# Patient Record
Sex: Female | Born: 1960 | Race: White | Hispanic: No | Marital: Married
Health system: Southern US, Community
[De-identification: ages and names within clinical notes are randomized; demographics above are authoritative.]

---

## 1997-09-01 ENCOUNTER — Ambulatory Visit (HOSPITAL_COMMUNITY): Admission: RE | Admit: 1997-09-01 | Discharge: 1997-09-01 | Payer: Self-pay | Admitting: Obstetrics and Gynecology

## 1998-06-19 ENCOUNTER — Other Ambulatory Visit: Admission: RE | Admit: 1998-06-19 | Discharge: 1998-06-19 | Payer: Self-pay | Admitting: Obstetrics and Gynecology

## 1999-05-17 ENCOUNTER — Ambulatory Visit (HOSPITAL_COMMUNITY): Admission: RE | Admit: 1999-05-17 | Discharge: 1999-05-17 | Payer: Self-pay | Admitting: *Deleted

## 1999-05-17 ENCOUNTER — Encounter: Payer: Self-pay | Admitting: Obstetrics and Gynecology

## 1999-07-09 ENCOUNTER — Other Ambulatory Visit: Admission: RE | Admit: 1999-07-09 | Discharge: 1999-07-09 | Payer: Self-pay | Admitting: Obstetrics and Gynecology

## 2000-12-20 ENCOUNTER — Other Ambulatory Visit: Admission: RE | Admit: 2000-12-20 | Discharge: 2000-12-20 | Payer: Self-pay | Admitting: Obstetrics and Gynecology

## 2001-12-21 ENCOUNTER — Other Ambulatory Visit: Admission: RE | Admit: 2001-12-21 | Discharge: 2001-12-21 | Payer: Self-pay | Admitting: Obstetrics and Gynecology

## 2003-01-29 ENCOUNTER — Other Ambulatory Visit: Admission: RE | Admit: 2003-01-29 | Discharge: 2003-01-29 | Payer: Self-pay | Admitting: Obstetrics and Gynecology

## 2004-03-31 ENCOUNTER — Other Ambulatory Visit: Admission: RE | Admit: 2004-03-31 | Discharge: 2004-03-31 | Payer: Self-pay | Admitting: Obstetrics and Gynecology

## 2005-06-27 ENCOUNTER — Other Ambulatory Visit: Admission: RE | Admit: 2005-06-27 | Discharge: 2005-06-27 | Payer: Self-pay | Admitting: Obstetrics and Gynecology

## 2012-06-13 ENCOUNTER — Other Ambulatory Visit: Payer: Self-pay | Admitting: Obstetrics and Gynecology

## 2012-06-13 DIAGNOSIS — R928 Other abnormal and inconclusive findings on diagnostic imaging of breast: Secondary | ICD-10-CM

## 2012-06-20 ENCOUNTER — Ambulatory Visit
Admission: RE | Admit: 2012-06-20 | Discharge: 2012-06-20 | Disposition: A | Discharge status: Home / Self Care | Payer: BC Managed Care – PPO | Source: Ambulatory Visit | Attending: Obstetrics and Gynecology | Admitting: Obstetrics and Gynecology

## 2012-06-20 DIAGNOSIS — R928 Other abnormal and inconclusive findings on diagnostic imaging of breast: Secondary | ICD-10-CM

## 2014-11-06 ENCOUNTER — Other Ambulatory Visit: Payer: Self-pay | Admitting: Obstetrics and Gynecology

## 2014-11-06 DIAGNOSIS — R928 Other abnormal and inconclusive findings on diagnostic imaging of breast: Secondary | ICD-10-CM

## 2014-11-12 ENCOUNTER — Ambulatory Visit
Admission: RE | Admit: 2014-11-12 | Discharge: 2014-11-12 | Disposition: A | Discharge status: Home / Self Care | Payer: BC Managed Care – PPO | Source: Ambulatory Visit | Attending: Obstetrics and Gynecology | Admitting: Obstetrics and Gynecology

## 2014-11-12 DIAGNOSIS — R928 Other abnormal and inconclusive findings on diagnostic imaging of breast: Secondary | ICD-10-CM

## 2018-10-09 ENCOUNTER — Other Ambulatory Visit: Payer: Self-pay | Admitting: Family Medicine

## 2018-10-09 DIAGNOSIS — H5713 Ocular pain, bilateral: Secondary | ICD-10-CM

## 2018-10-16 ENCOUNTER — Ambulatory Visit
Admission: RE | Admit: 2018-10-16 | Discharge: 2018-10-16 | Disposition: A | Discharge status: Home / Self Care | Payer: BC Managed Care – PPO | Source: Ambulatory Visit | Attending: Family Medicine | Admitting: Family Medicine

## 2018-10-16 DIAGNOSIS — H5713 Ocular pain, bilateral: Secondary | ICD-10-CM

## 2021-05-07 ENCOUNTER — Other Ambulatory Visit: Payer: Self-pay | Admitting: Family Medicine

## 2021-05-07 DIAGNOSIS — Z780 Asymptomatic menopausal state: Secondary | ICD-10-CM

## 2021-05-07 DIAGNOSIS — Z1231 Encounter for screening mammogram for malignant neoplasm of breast: Secondary | ICD-10-CM

## 2021-06-29 ENCOUNTER — Ambulatory Visit
Admission: RE | Admit: 2021-06-29 | Discharge: 2021-06-29 | Disposition: A | Discharge status: Home / Self Care | Payer: BC Managed Care – PPO | Source: Ambulatory Visit | Attending: Family Medicine | Admitting: Family Medicine

## 2021-06-29 DIAGNOSIS — Z1231 Encounter for screening mammogram for malignant neoplasm of breast: Secondary | ICD-10-CM

## 2021-06-30 ENCOUNTER — Other Ambulatory Visit: Payer: Self-pay | Admitting: Family Medicine

## 2021-06-30 DIAGNOSIS — R928 Other abnormal and inconclusive findings on diagnostic imaging of breast: Secondary | ICD-10-CM

## 2021-07-15 ENCOUNTER — Other Ambulatory Visit: Payer: Self-pay | Admitting: Family Medicine

## 2021-07-15 ENCOUNTER — Ambulatory Visit
Admission: RE | Admit: 2021-07-15 | Discharge: 2021-07-15 | Disposition: A | Discharge status: Home / Self Care | Payer: BC Managed Care – PPO | Source: Ambulatory Visit | Attending: Family Medicine | Admitting: Family Medicine

## 2021-07-15 DIAGNOSIS — R928 Other abnormal and inconclusive findings on diagnostic imaging of breast: Secondary | ICD-10-CM

## 2021-12-01 ENCOUNTER — Ambulatory Visit: Payer: 59 | Attending: Family Medicine

## 2021-12-01 DIAGNOSIS — M6281 Muscle weakness (generalized): Secondary | ICD-10-CM | POA: Insufficient documentation

## 2021-12-01 DIAGNOSIS — R279 Unspecified lack of coordination: Secondary | ICD-10-CM | POA: Diagnosis not present

## 2021-12-01 DIAGNOSIS — N8110 Cystocele, unspecified: Secondary | ICD-10-CM | POA: Diagnosis present

## 2021-12-01 NOTE — Therapy (Addendum)
?OUTPATIENT PHYSICAL THERAPY FEMALE PELVIC EVALUATION ? ? ?Patient Name: Donna Decker ?MRN: [id number] ?DOB:[birth year], 61 y.o., female ?Today's Date: 12/06/2021 ? ? ? ? ?History reviewed. No pertinent past medical history. ?History reviewed. No pertinent surgical history. ?There are no problems to display for this patient. ? ? ?PCP: Sigmund Hazel, MD ? ?REFERRING PROVIDER: Gweneth Dimitri, MD ? ?REFERRING DIAG: N81.10 (ICD-10-CM) - Cystocele, unspecified ? ?THERAPY DIAG:  ?Muscle weakness (generalized) - Plan: PT plan of care cert/re-cert ? ?Unspecified lack of coordination - Plan: PT plan of care cert/re-cert ? ?ONSET DATE: 09/01/21 ? ?SUBJECTIVE:                                                                                                                                                                                          ? ?SUBJECTIVE STATEMENT: ?Pt states that she is very healthy and strong and she was recently laying patio in the back yard. She was laying 35lb blocks and starting to feel heaviness in vaginal canal. She went to the bathroom and noticed that something was starting to bulge out of the vagina. She states that she does kegels frequently.  ?Fluid intake: Yes: at least 64oz a day, cup of coffee and cup of decaf tea   ? ?Patient confirms identification and approves PT to assess pelvic floor and treatment Yes ? ? ?PAIN:  ?Are you having pain? No ?NPRS scale: 0/10 ? ? ?PRECAUTIONS: None ? ?WEIGHT BEARING RESTRICTIONS No ? ?FALLS:  ?Has patient fallen in last 6 months? No ? ?LIVING ENVIRONMENT: ?Lives with: lives with their family ?Lives in: House/apartment ? ?OCCUPATION: retired ? ?PLOF: Independent ? ?PATIENT GOALS decrease sensation of vagina pressure ? ?PERTINENT HISTORY:  ?1 complicated vaginal delivery ?Sexual abuse: No ? ?BOWEL MOVEMENT ?Pain with bowel movement: No ?Type of bowel movement:Frequency 1x/day and Strain No ?Fully empty rectum: No ?Leakage: No ?Pads: No ?Fiber supplement:  No ? ?URINATION ?Pain with urination: No ?Fully empty bladder: Yes: but she is more aware of rushing ?Stream:  WNL ?Urgency: No ?Frequency: every 2-3 hours, getting up at night more - 1x/night ?Leakage: Lifting ?Pads: No ? ?INTERCOURSE ?Pain with intercourse:  NOne ?Ability to have vaginal penetration:  Yes: - ?Climax: yes ? ? ?PREGNANCY ?Vaginal deliveries 1 (2 adopted children) ?Tearing Yes: large episiotomy, long labor, coccyx fracture ?C-section deliveries 0 ?Currently pregnant No ? ?PROLAPSE ?Cystocele patient does not typically have sensation of lower abdominal pressure, but after days of lots of heavy lifting, she will; she denies any back pain ? ? ? ?OBJECTIVE:  ? ?COGNITION: ? Overall cognitive status: Within functional limits for tasks assessed   ?  ?  SENSATION: ? Light touch: Appears intact ? Proprioception: Appears intact ? ? ? ?PELVIC MMT: ?Pelvic floor strength 2/5 ?Good coordination of contraction with 6 repetitions - some difficulty with full relaxation ? Pelvic floor endurance 4 seconds ? ? ?      PALPATION: ?  General  Diastasis 2 inch at and above umbilicus without distortion ? ?              External Perineal Exam palor/dryness noted ?              ?              Internal Pelvic Floor no tenderness ? ?TONE: ?WNL ? ?PROLAPSE: ?Grade 1 anterior vaginal wall laxity in supine - pt deferred standing assessment ? ?TODAY'S TREATMENT 12/01/21 ?EVAL  ?Manual: ?Soft tissue mobilization: ?Scar tissue mobilization: ?Myofascial release: ?Spinal mobilization: ?Internal pelvic floor techniques: ?Dry needling: ?Neuromuscular re-education: ?Core retraining:  ?Core facilitation: ?Form correction: ?Pelvic floor contraction training: ?Quick flicks 10x with breath coordination ?Long holds 5 x 10 seconds ?Down training: ?Exercises: ?Stretches/mobility: ?Strengthening: ?Therapeutic activities: ?Functional strengthening activities: ?Self-care: ?Vulvovaginal massage ?Inverted lying ? ?  ? ? ? ?PATIENT EDUCATION:  ?Education  details: see above self-care ?Person educated: Patient ?Education method: Explanation, Demonstration, Tactile cues, Verbal cues, and Handouts ?Education comprehension: verbalized understanding ? ? ?HOME EXERCISE PROGRAM: ?[id number] ? ?ASSESSMENT: ? ?CLINICAL IMPRESSION: ?Patient is a 61 y.o. female who was seen today for physical therapy evaluation and treatment for sensation of vaginal bulging. Exam findings notable for vulvar dryness/pallor, diastasis recti 2 finger width separation at and above umbilicus without distortion, pelvic floor weakness 2/5, decreased pelvic floor endurance 4 seconds, mild difficulty with full relaxation with repeat contractions, and grade 1 anterior vaginal wall laxity only assessed in supine per patient request. Initial treatment consisted on inverted lying for when patient is feeling increased symptoms,vulvar moisturizing, and initial pelvic floor strengthening program. She will benefit from skilled PT intervention in order to strengthen pelvic floor, teach techniques to help reduce amount of vaginal pressure she feels during strenuous activities, and begin functional strengthening program for core/pelvic floor.  ? ? ?OBJECTIVE IMPAIRMENTS decreased activity tolerance, decreased coordination, decreased endurance, and decreased strength.  ? ?ACTIVITY LIMITATIONS  physical activity .  ? ?PERSONAL FACTORS 1 comorbidity: complicated vaginal delivery  are also affecting patient's functional outcome.  ? ? ?REHAB POTENTIAL: Good ? ?CLINICAL DECISION MAKING: Stable/uncomplicated ? ?EVALUATION COMPLEXITY: Low ? ? ?GOALS: ?Goals reviewed with patient? Yes ? ?SHORT TERM GOALS: Target date: 01/03/2022 ? ?Pt will be independent with HEP.  ? ?Baseline: ?Goal status: INITIAL ? ?2.  Pt will be able to correctly perform diaphragmatic breathing and appropriate pressure management in order to prevent worsening vaginal wall laxity and improve pelvic floor A/ROM.  ? ?Baseline:  ?Goal status:  INITIAL ? ? ?LONG TERM GOALS: Target date: 02/23/22 ? ?Pt will be independent with advanced HEP.  ? ?Baseline:  ?Goal status: INITIAL ? ?2.  Pt will demonstrate normal pelvic floor muscle tone and A/ROM, able to achieve 4/5 strength with contractions and 10 sec endurance, in order to provide appropriate lumbopelvic support in functional activities.  ? ?Baseline:  ?Goal status: INITIAL ? ?3.  Pt will demonstrate appropriate abdominal pressure management, breath coordination, and pelvic floor/core contraction with functional movements and lifting activities.  ?Baseline:  ?Goal status: INITIAL ? ? ? ?PLAN: ?PT FREQUENCY: 1x/week ? ?PT DURATION: 12 weeks ? ?PLANNED INTERVENTIONS: Therapeutic exercises, Therapeutic activity, Neuromuscular re-education, Balance training, Gait training, Patient/Family  education, Joint mobilization, Dry Needling, Biofeedback, and Manual therapy ? ?PLAN FOR NEXT SESSION: Begin core training; start progressing core and pelvic floor into functional strengthening.  ? ?Julio AlmKristen Aubreana Cornacchia, PT, DPT05/08/238:51 AM ? ? ?

## 2021-12-01 NOTE — Patient Instructions (Signed)
Relieving pelvic pressure: ?Lying in an inverted position can help relieve the ?sensation of pelvic pressure ?Your legs can be up the wall, on a chair, or over the couch; the important ?thing is to make sure you have hips are elevated ? ?? Perform this position: ?When you feel pressure throughout the day ?At the end of the day ?While in this position, perform deep, diaphragmatic breathing to help all of ?your muscles relax lying ? ?

## 2021-12-16 ENCOUNTER — Ambulatory Visit: Payer: 59

## 2021-12-16 DIAGNOSIS — N8110 Cystocele, unspecified: Secondary | ICD-10-CM | POA: Diagnosis not present

## 2021-12-16 DIAGNOSIS — R279 Unspecified lack of coordination: Secondary | ICD-10-CM

## 2021-12-16 DIAGNOSIS — M6281 Muscle weakness (generalized): Secondary | ICD-10-CM

## 2021-12-16 NOTE — Therapy (Signed)
OUTPATIENT PHYSICAL THERAPY TREATMENT NOTE   Patient Name: Donna Decker MRN: [id number] DOB:[birth year], 61 y.o., female Today's Date: 12/16/2021  PCP: Kathyrn Lass, MD REFERRING PROVIDER: Cari Caraway, MD  END OF SESSION:   PT End of Session - 12/16/21 1146     Visit Number 2    Date for PT Re-Evaluation 02/23/22    Authorization Type Friday    PT Start Time 1145    PT Stop Time 1223    PT Time Calculation (min) 38 min    Activity Tolerance Patient tolerated treatment well    Behavior During Therapy [facility] for tasks assessed/performed             History reviewed. No pertinent past medical history. History reviewed. No pertinent surgical history. There are no problems to display for this patient.   REFERRING DIAG: N81.10 (ICD-10-CM) - Cystocele, unspecified  THERAPY DIAG:  Muscle weakness (generalized)  Unspecified lack of coordination  PERTINENT HISTORY: 1 complicated vaginal delivery  PRECAUTIONS: NA  SUBJECTIVE: Pt states that she has been working on exercises, but is ready to move onto something more challenging.   PAIN:  Are you having pain? No   SUBJECTIVE STATEMENT: Pt states that she is very healthy and strong and she was recently laying patio in the back yard. She was laying 35lb blocks and starting to feel heaviness in vaginal canal. She went to the bathroom and noticed that something was starting to bulge out of the vagina. She states that she does kegels frequently.  Fluid intake: Yes: at least 64oz a day, cup of coffee and cup of decaf tea     Patient confirms identification and approves PT to assess pelvic floor and treatment Yes     PAIN:  Are you having pain? No NPRS scale: 0/10     PRECAUTIONS: None   WEIGHT BEARING RESTRICTIONS No   FALLS:  Has patient fallen in last 6 months? No   LIVING ENVIRONMENT: Lives with: lives with their family Lives in: House/apartment   OCCUPATION: retired   PLOF: Independent   PATIENT GOALS  decrease sensation of vagina pressure   PERTINENT HISTORY:  1 complicated vaginal delivery Sexual abuse: No   BOWEL MOVEMENT Pain with bowel movement: No Type of bowel movement:Frequency 1x/day and Strain No Fully empty rectum: No Leakage: No Pads: No Fiber supplement: No   URINATION Pain with urination: No Fully empty bladder: Yes: but she is more aware of rushing Stream:  WNL Urgency: No Frequency: every 2-3 hours, getting up at night more - 1x/night Leakage: Lifting Pads: No   INTERCOURSE Pain with intercourse:  NOne Ability to have vaginal penetration:  Yes: - Climax: yes     PREGNANCY Vaginal deliveries 1 (2 adopted children) Tearing Yes: large episiotomy, long labor, coccyx fracture C-section deliveries 0 Currently pregnant No   PROLAPSE Cystocele patient does not typically have sensation of lower abdominal pressure, but after days of lots of heavy lifting, she will; she denies any back pain       OBJECTIVE:    COGNITION:            Overall cognitive status: Within functional limits for tasks assessed                          SENSATION:            Light touch: Appears intact            Proprioception: Appears intact  PELVIC MMT: Pelvic floor strength 2/5 Good coordination of contraction with 6 repetitions - some difficulty with full relaxation  Pelvic floor endurance 4 seconds           PALPATION:   General  Diastasis 2 inch at and above umbilicus without distortion                 External Perineal Exam palor/dryness noted                             Internal Pelvic Floor no tenderness   TONE: WNL   PROLAPSE: Grade 1 anterior vaginal wall laxity in supine - pt deferred standing assessment   TODAY'S TREATMENT 12/16/21: Manual: Soft tissue mobilization: Scar tissue mobilization: Myofascial release: Spinal mobilization: Internal pelvic floor techniques: Dry needling: Neuromuscular re-education: Core retraining:  Transversus  abdominus training with multimodal cues and incorporation of pelvic floor and breath coordination Core facilitation: Supine march 2 x 10 Leg extensions 10x bil Cat/cow 10x Bird dog 2 x 10 Bear crawl holds 5 x 10 seconds Form correction: Pelvic floor contraction training: Down training: Exercises: Stretches/mobility: Strengthening: Bil UE extensions 2 x 10 green band  Pallof press 10x bil green band Kneeling chop 5 lbs 10x bil Therapeutic activities: Functional strengthening activities: Self-care: Poise impressa - sizing kit given Pressure management Reviewed inverted lying and best position for actual reduction of prolapse if she is feeling   TREATMENT 12/01/21 EVAL  Manual: Soft tissue mobilization: Scar tissue mobilization: Myofascial release: Spinal mobilization: Internal pelvic floor techniques: Dry needling: Neuromuscular re-education: Core retraining:  Core facilitation: Form correction: Pelvic floor contraction training: Quick flicks 66M with breath coordination Long holds 5 x 10 seconds Down training: Exercises: Stretches/mobility: Strengthening: Therapeutic activities: Functional strengthening activities: Self-care: Vulvovaginal massage Inverted lying                         PATIENT EDUCATION:  Education details: Exercise progressions Person educated: Patient Education method: Consulting civil engineer, Demonstration, Corporate treasurer cues, Verbal cues, and Handouts Education comprehension: verbalized understanding     HOME EXERCISE PROGRAM: [id number]   ASSESSMENT:   CLINICAL IMPRESSION: Pt encouraged to perform inverted lying more regularly when she feels vaginal pressure to help reduce symptoms. We discussed poise impressa as great temporary option if she is feeling more symptoms or knows that she will be doing a lot of heavy lifting; sizing kit given and she was instructed to start with size 2. She did very well with core training and incorporation of pelvic floor  with breath. She was able to incorporate this training into more functional activities and given updated HEP. She will benefit from skilled PT intervention in order to strengthen pelvic floor, teach techniques to help reduce amount of vaginal pressure she feels during strenuous activities, and begin functional strengthening program for core/pelvic floor.      OBJECTIVE IMPAIRMENTS decreased activity tolerance, decreased coordination, decreased endurance, and decreased strength.    ACTIVITY LIMITATIONS  physical activity .    PERSONAL FACTORS 1 comorbidity: complicated vaginal delivery  are also affecting patient's functional outcome.      REHAB POTENTIAL: Good   CLINICAL DECISION MAKING: Stable/uncomplicated   EVALUATION COMPLEXITY: Low     GOALS: Goals reviewed with patient? Yes   SHORT TERM GOALS: Target date: 01/03/2022   Pt will be independent with HEP.    Baseline: Goal status: INITIAL   2.  Pt will be able  to correctly perform diaphragmatic breathing and appropriate pressure management in order to prevent worsening vaginal wall laxity and improve pelvic floor A/ROM.    Baseline:  Goal status: INITIAL     LONG TERM GOALS: Target date: 02/23/22   Pt will be independent with advanced HEP.    Baseline:  Goal status: INITIAL   2.  Pt will demonstrate normal pelvic floor muscle tone and A/ROM, able to achieve 4/5 strength with contractions and 10 sec endurance, in order to provide appropriate lumbopelvic support in functional activities.    Baseline:  Goal status: INITIAL   3.  Pt will demonstrate appropriate abdominal pressure management, breath coordination, and pelvic floor/core contraction with functional movements and lifting activities.  Baseline:  Goal status: INITIAL       PLAN: PT FREQUENCY: 1x/week   PT DURATION: 12 weeks   PLANNED INTERVENTIONS: Therapeutic exercises, Therapeutic activity, Neuromuscular re-education, Balance training, Gait training,  Patient/Family education, Joint mobilization, Dry Needling, Biofeedback, and Manual therapy   PLAN FOR NEXT SESSION: Progress core/pelvic floor strengthening with appropriate pressure management; big functional movements with more hip strengthening incorporated.    Heather Roberts, PT, DPT05/18/2312:25 PM

## 2022-01-04 ENCOUNTER — Ambulatory Visit: Payer: 59 | Attending: Family Medicine

## 2022-01-04 DIAGNOSIS — M6281 Muscle weakness (generalized): Secondary | ICD-10-CM | POA: Diagnosis present

## 2022-01-04 DIAGNOSIS — R279 Unspecified lack of coordination: Secondary | ICD-10-CM | POA: Diagnosis present

## 2022-01-04 NOTE — Therapy (Signed)
OUTPATIENT PHYSICAL THERAPY TREATMENT NOTE   Patient Name: Donna Decker MRN: [id number] DOB:1960/12/21, 61 y.o., female Today's Date: 01/04/2022  PCP: Kathyrn Lass, MD REFERRING PROVIDER: Cari Caraway, MD  END OF SESSION:   PT End of Session - 01/04/22 1017     Visit Number 3    Date for PT Re-Evaluation 02/23/22    Authorization Type Friday    PT Start Time 1015    PT Stop Time 1055    PT Time Calculation (min) 40 min    Activity Tolerance Patient tolerated treatment well    Behavior During Therapy [facility] for tasks assessed/performed              History reviewed. No pertinent past medical history. History reviewed. No pertinent surgical history. There are no problems to display for this patient.   REFERRING DIAG: N81.10 (ICD-10-CM) - Cystocele, unspecified  THERAPY DIAG:  Muscle weakness (generalized)  Unspecified lack of coordination  PERTINENT HISTORY: 1 complicated vaginal delivery  PRECAUTIONS: NA  SUBJECTIVE: Pt states that she is feeling good. She is doing all exercises once a day. She reports feeling much stronger in her core. She does still feel bulge when she is going to the bathroom and wiping. She denies any pain. She feels like exercises are also helping her low back pain. She has not used poise impressa.   PAIN:  Are you having pain? No   SUBJECTIVE STATEMENT: Pt states that she is very healthy and strong and she was recently laying patio in the back yard. She was laying 35lb blocks and starting to feel heaviness in vaginal canal. She went to the bathroom and noticed that something was starting to bulge out of the vagina. She states that she does kegels frequently.  Fluid intake: Yes: at least 64oz a day, cup of coffee and cup of decaf tea     Patient confirms identification and approves PT to assess pelvic floor and treatment Yes     PAIN:  Are you having pain? No NPRS scale: 0/10     PRECAUTIONS: None   WEIGHT BEARING RESTRICTIONS  No   FALLS:  Has patient fallen in last 6 months? No   LIVING ENVIRONMENT: Lives with: lives with their family Lives in: House/apartment   OCCUPATION: retired   PLOF: Independent   PATIENT GOALS decrease sensation of vagina pressure   PERTINENT HISTORY:  1 complicated vaginal delivery Sexual abuse: No   BOWEL MOVEMENT Pain with bowel movement: No Type of bowel movement:Frequency 1x/day and Strain No Fully empty rectum: No Leakage: No Pads: No Fiber supplement: No   URINATION Pain with urination: No Fully empty bladder: Yes: but she is more aware of rushing Stream:  WNL Urgency: No Frequency: every 2-3 hours, getting up at night more - 1x/night Leakage: Lifting Pads: No   INTERCOURSE Pain with intercourse:  NOne Ability to have vaginal penetration:  Yes: - Climax: yes     PREGNANCY Vaginal deliveries 1 (2 adopted children) Tearing Yes: large episiotomy, long labor, coccyx fracture C-section deliveries 0 Currently pregnant No   PROLAPSE Cystocele patient does not typically have sensation of lower abdominal pressure, but after days of lots of heavy lifting, she will; she denies any back pain       OBJECTIVE:    COGNITION:            Overall cognitive status: Within functional limits for tasks assessed  SENSATION:            Light touch: Appears intact            Proprioception: Appears intact       PELVIC MMT: Pelvic floor strength 2/5 Good coordination of contraction with 6 repetitions - some difficulty with full relaxation  Pelvic floor endurance 4 seconds           PALPATION:   General  Diastasis 2 inch at and above umbilicus without distortion                 External Perineal Exam palor/dryness noted                             Internal Pelvic Floor no tenderness   TONE: WNL   PROLAPSE: Grade 1 anterior vaginal wall laxity in supine - pt deferred standing assessment   TODAY'S TREATMENT 01/04/22 Neuromuscular  re-education: Core facilitation: Deadbug 2 x 10 Bird dog row 10x bil, 5 lbs Bird dog crunch 10x bil  Exercises: Strengthening: Fire hydrants 10x bil Donkey kicks 10x bil Side plank clam shells 10x bil (modified side plank on knees) Squats with anterior weight hold 2 x 10, 5lbs Deadlift 5lb 2 x 10 Sidestepping 1 long laps red band   TREATMENT 12/16/21: Neuromuscular re-education: Core retraining:  Transversus abdominus training with multimodal cues and incorporation of pelvic floor and breath coordination Core facilitation: Supine march 2 x 10 Leg extensions 10x bil Cat/cow 10x Bird dog 2 x 10 Bear crawl holds 5 x 10 seconds Form correction: Pelvic floor contraction training: Down training: Exercises: Stretches/mobility: Strengthening: Bil UE extensions 2 x 10 green band  Pallof press 10x bil green band Kneeling chop 5 lbs 10x bil Therapeutic activities: Functional strengthening activities: Self-care: Poise impressa - sizing kit given Pressure management Reviewed inverted lying and best position for actual reduction of prolapse if she is feeling   TREATMENT 12/01/21 EVAL  Manual: Soft tissue mobilization: Scar tissue mobilization: Myofascial release: Spinal mobilization: Internal pelvic floor techniques: Dry needling: Neuromuscular re-education: Core retraining:  Core facilitation: Form correction: Pelvic floor contraction training: Quick flicks 20B with breath coordination Long holds 5 x 10 seconds Down training: Exercises: Stretches/mobility: Strengthening: Therapeutic activities: Functional strengthening activities: Self-care: Vulvovaginal massage Inverted lying                         PATIENT EDUCATION:  Education details: Exercise progressions Person educated: Patient Education method: Consulting civil engineer, Demonstration, Corporate treasurer cues, Verbal cues, and Handouts Education comprehension: verbalized understanding     HOME EXERCISE  PROGRAM: [id number]   ASSESSMENT:   CLINICAL IMPRESSION: Patient has done very well in skilled PT intervention demonstrated by good understanding of prolapse and pressure management. She has been able to initiate and progress pelvic floor/core strengthening program with good coordination and control. We reviewed symptom management for when she does feel more of a bulge in vagina. She was encouraged to call with any questions or concerns. Due to progress and having met goals, she is prepared to D/C skilled PT intervention.      OBJECTIVE IMPAIRMENTS decreased activity tolerance, decreased coordination, decreased endurance, and decreased strength.    ACTIVITY LIMITATIONS  physical activity .    PERSONAL FACTORS 1 comorbidity: complicated vaginal delivery  are also affecting patient's functional outcome.      REHAB POTENTIAL: Good   CLINICAL DECISION MAKING: Stable/uncomplicated   EVALUATION COMPLEXITY: Low  GOALS: Goals reviewed with patient? Yes   SHORT TERM GOALS: Target date: 01/03/2022   Pt will be independent with HEP.    Baseline: Goal status: MET 01/04/22   2.  Pt will be able to correctly perform diaphragmatic breathing and appropriate pressure management in order to prevent worsening vaginal wall laxity and improve pelvic floor A/ROM.    Baseline:  Goal status: MET 01/04/22     LONG TERM GOALS: Target date: 02/23/22   Pt will be independent with advanced HEP.    Baseline:  Goal status: MET 01/04/22   2.  Pt will demonstrate normal pelvic floor muscle tone and A/ROM, able to achieve 4/5 strength with contractions and 10 sec endurance, in order to provide appropriate lumbopelvic support in functional activities.    Baseline: Not formally assessed due to patient request - no symptoms  Goal status: EXPECT THAT THIS GOAL HAS BEEN MET 01/04/22   3.  Pt will demonstrate appropriate abdominal pressure management, breath coordination, and pelvic floor/core contraction with  functional movements and lifting activities.  Baseline:  Goal status: MET 01/04/22       PLAN: PT FREQUENCY: 1x/week   PT DURATION: 12 weeks   PLANNED INTERVENTIONS: Therapeutic exercises, Therapeutic activity, Neuromuscular re-education, Balance training, Gait training, Patient/Family education, Joint mobilization, Dry Needling, Biofeedback, and Manual therapy   PLAN FOR NEXT SESSION: D/C  PHYSICAL THERAPY DISCHARGE SUMMARY  Visits from Start of Care: 3  Current functional level related to goals / functional outcomes: Complete   Remaining deficits: See above   Education / Equipment: HEP   Patient agrees to discharge. Patient goals were met. Patient is being discharged due to meeting the stated rehab goals.  Heather Roberts, PT, DPT06/06/231:29 PM

## 2022-05-04 DIAGNOSIS — Z124 Encounter for screening for malignant neoplasm of cervix: Secondary | ICD-10-CM | POA: Diagnosis not present

## 2022-05-04 DIAGNOSIS — Z1211 Encounter for screening for malignant neoplasm of colon: Secondary | ICD-10-CM | POA: Diagnosis not present

## 2022-05-04 DIAGNOSIS — R87610 Atypical squamous cells of undetermined significance on cytologic smear of cervix (ASC-US): Secondary | ICD-10-CM | POA: Diagnosis not present

## 2022-05-04 DIAGNOSIS — Z0000 Encounter for general adult medical examination without abnormal findings: Secondary | ICD-10-CM | POA: Diagnosis not present

## 2022-05-04 DIAGNOSIS — E785 Hyperlipidemia, unspecified: Secondary | ICD-10-CM | POA: Diagnosis not present

## 2022-05-04 DIAGNOSIS — G43009 Migraine without aura, not intractable, without status migrainosus: Secondary | ICD-10-CM | POA: Diagnosis not present

## 2023-05-13 DIAGNOSIS — B349 Viral infection, unspecified: Secondary | ICD-10-CM | POA: Diagnosis not present

## 2023-05-22 DIAGNOSIS — R051 Acute cough: Secondary | ICD-10-CM | POA: Diagnosis not present

## 2023-06-01 IMAGING — MG MM DIGITAL DIAGNOSTIC UNILAT*L* W/ TOMO W/ CAD
8 series · 8 of 24 positions shown · non-contrast
Comparison: Previous exam(s).

CLINICAL DATA: Patient recalled from screening for left breast
asymmetry.

EXAM:
DIGITAL DIAGNOSTIC UNILATERAL LEFT MAMMOGRAM WITH TOMOSYNTHESIS AND
CAD; ULTRASOUND LEFT BREAST LIMITED
TECHNIQUE: Left digital diagnostic mammography and breast tomosynthesis was
performed. The images were evaluated with computer-aided detection.;
Targeted ultrasound examination of the left breast was performed.

[L MLO synth-2D (1 of 2)]
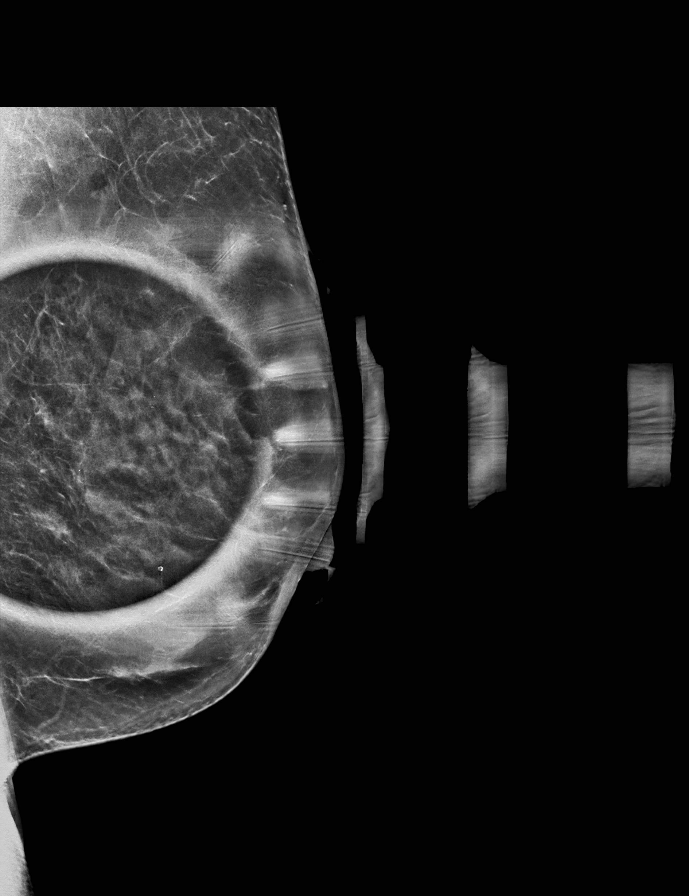

[L CC synth-2D]
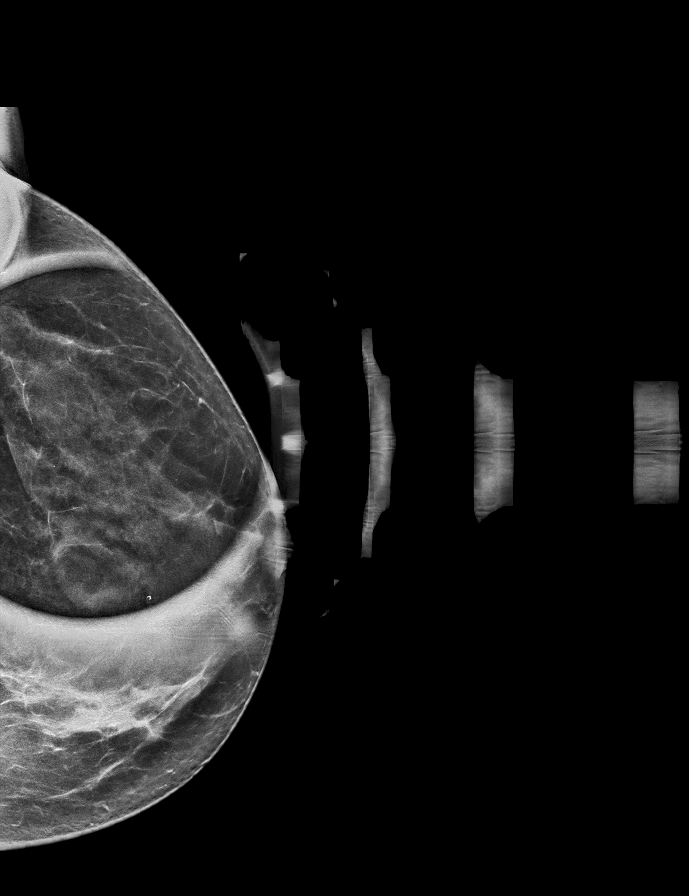

[L MLO synth-2D (2 of 2)]
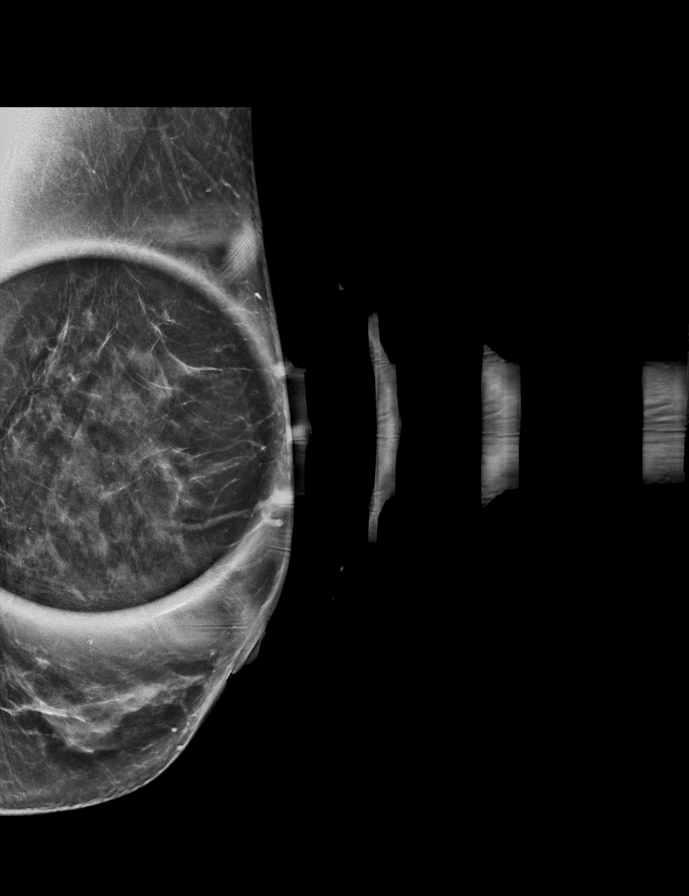

[L ML synth-2D]
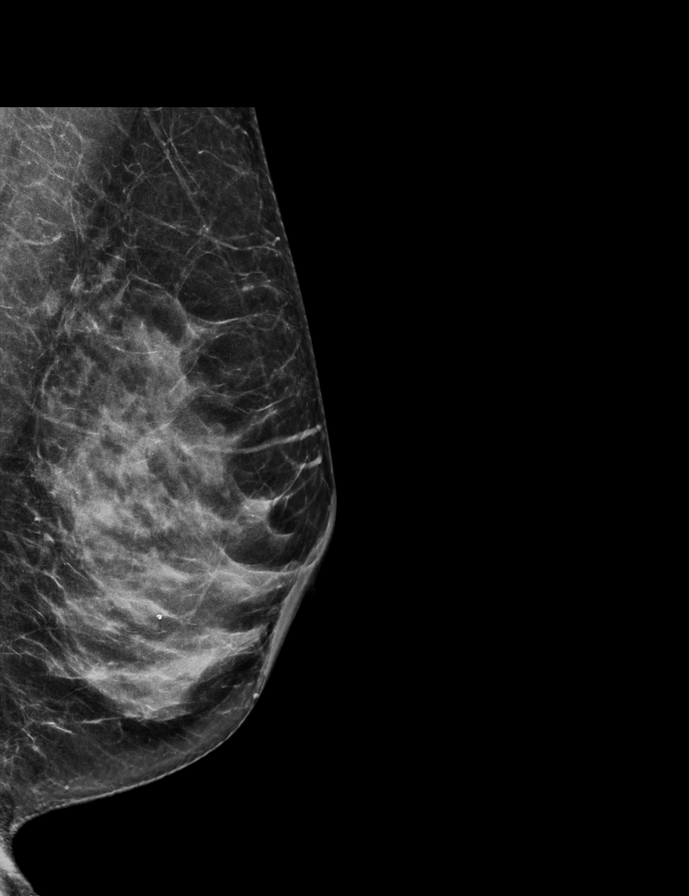

[L CC tomo · tomo slice 31/60.0]
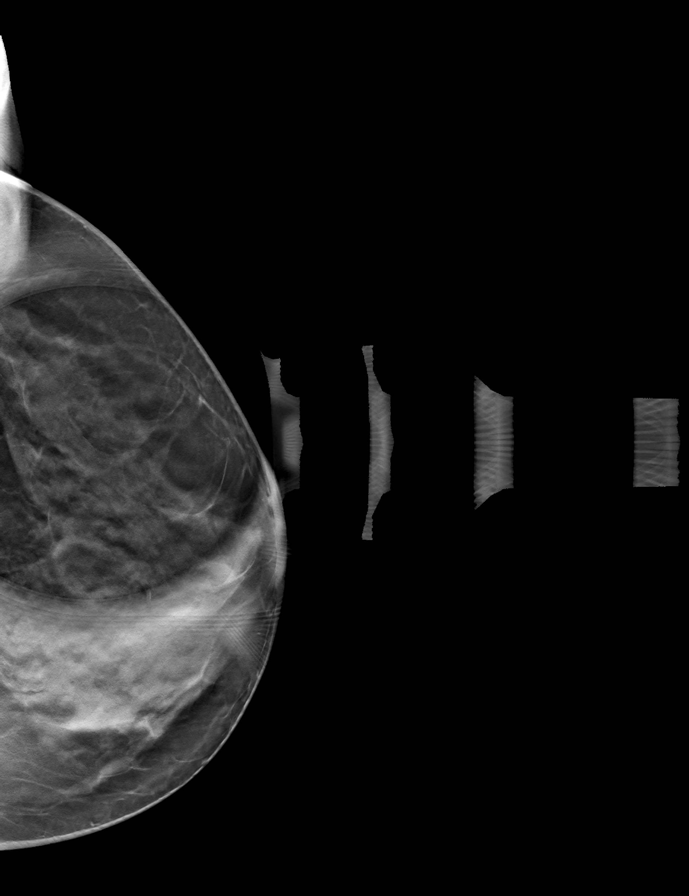

[L ML tomo · tomo slice 30/59.0]
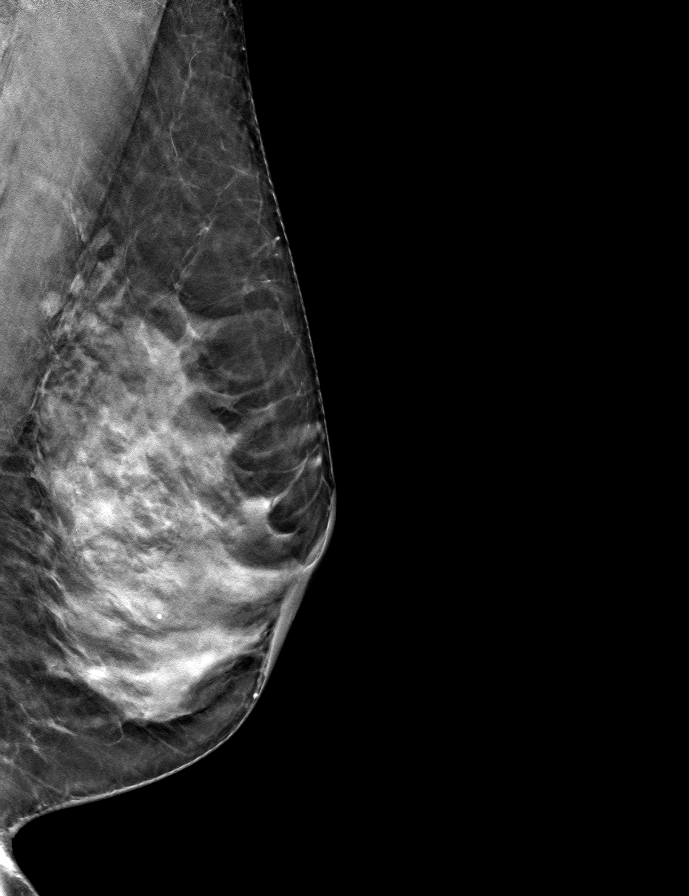

[L MLO tomo (1 of 2) · tomo slice 33/64.0]
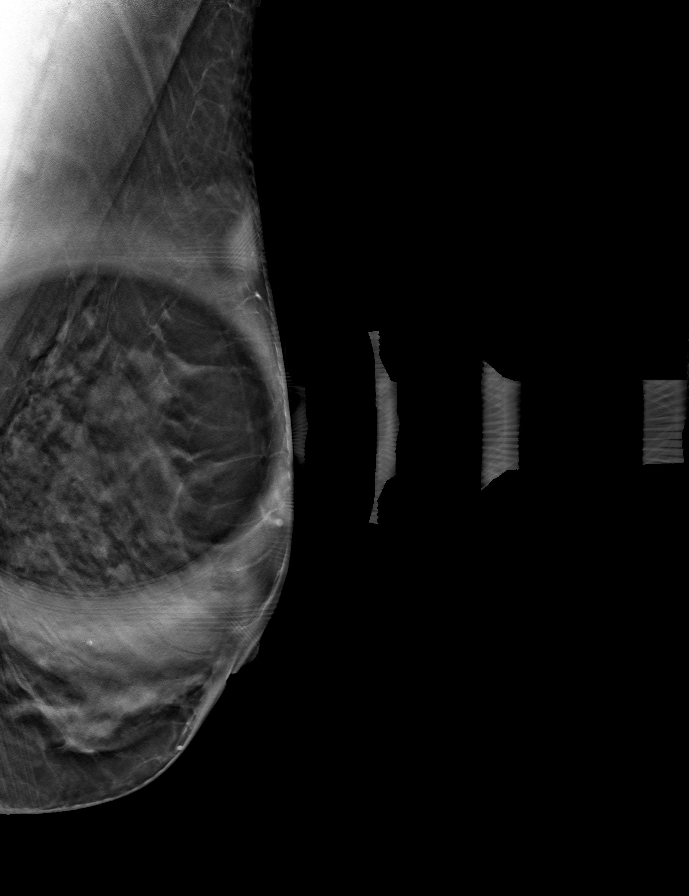

[L MLO tomo (2 of 2) · tomo slice 30/59.0]
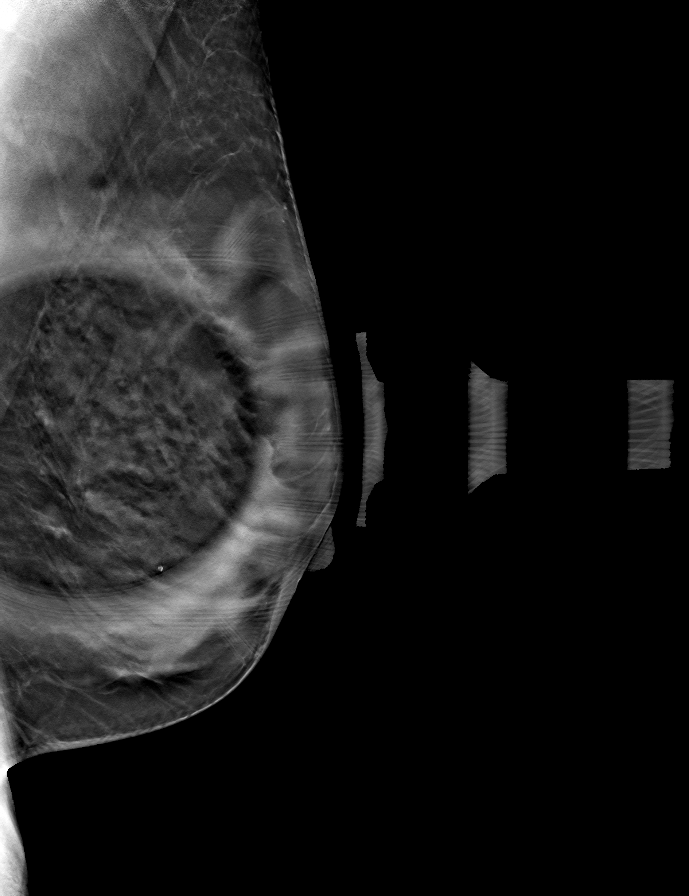

[8 of 24 positions shown; findings below may reference images not displayed]

ACR Breast Density Category c: The breast tissue is heterogeneously
dense, which may obscure small masses.
FINDINGS: Questioned asymmetry within the left breast on the MLO view
partially effaced with additional imaging suggestive of dense
fibroglandular tissue.

On physical exam, no discrete mass is palpated within the outer left
breast.

Targeted ultrasound is performed, showing no suspicious masses
identified within the outer left breast posterior depth. Normal
tissue is visualized.
IMPRESSION: Probably benign asymmetry posterior left breast.

RECOMMENDATION:
Left breast diagnostic mammogram in 6 months reassess the asymmetry
within the posterior left breast.

I have discussed the findings and recommendations with the patient.
If applicable, a reminder letter will be sent to the patient
regarding the next appointment.

BI-RADS CATEGORY  3: Probably benign.

## 2023-06-06 DIAGNOSIS — R052 Subacute cough: Secondary | ICD-10-CM | POA: Diagnosis not present

## 2023-06-06 DIAGNOSIS — R87610 Atypical squamous cells of undetermined significance on cytologic smear of cervix (ASC-US): Secondary | ICD-10-CM | POA: Diagnosis not present

## 2023-06-06 DIAGNOSIS — Z6820 Body mass index (BMI) 20.0-20.9, adult: Secondary | ICD-10-CM | POA: Diagnosis not present

## 2023-06-06 DIAGNOSIS — G43009 Migraine without aura, not intractable, without status migrainosus: Secondary | ICD-10-CM | POA: Diagnosis not present

## 2023-06-06 DIAGNOSIS — F418 Other specified anxiety disorders: Secondary | ICD-10-CM | POA: Diagnosis not present

## 2023-06-06 DIAGNOSIS — Z23 Encounter for immunization: Secondary | ICD-10-CM | POA: Diagnosis not present

## 2023-06-06 DIAGNOSIS — Z0000 Encounter for general adult medical examination without abnormal findings: Secondary | ICD-10-CM | POA: Diagnosis not present

## 2023-06-15 DIAGNOSIS — Z1211 Encounter for screening for malignant neoplasm of colon: Secondary | ICD-10-CM | POA: Diagnosis not present
